# Patient Record
Sex: Male | Born: 1945 | Race: White | Hispanic: No | Marital: Married | Smoking: Never smoker
Health system: Southern US, Community
[De-identification: ages and names within clinical notes are randomized; demographics above are authoritative.]

---

## 2017-10-24 ENCOUNTER — Other Ambulatory Visit: Payer: Self-pay

## 2017-10-24 ENCOUNTER — Emergency Department (INDEPENDENT_AMBULATORY_CARE_PROVIDER_SITE_OTHER): Payer: Medicare Other

## 2017-10-24 ENCOUNTER — Emergency Department (INDEPENDENT_AMBULATORY_CARE_PROVIDER_SITE_OTHER)
Admission: EM | Admit: 2017-10-24 | Discharge: 2017-10-24 | Disposition: A | Discharge status: Home / Self Care | Payer: Medicare Other | Source: Home / Self Care | Attending: Family Medicine | Admitting: Family Medicine

## 2017-10-24 DIAGNOSIS — I70201 Unspecified atherosclerosis of native arteries of extremities, right leg: Secondary | ICD-10-CM | POA: Diagnosis not present

## 2017-10-24 DIAGNOSIS — S8391XA Sprain of unspecified site of right knee, initial encounter: Secondary | ICD-10-CM

## 2017-10-24 DIAGNOSIS — M1711 Unilateral primary osteoarthritis, right knee: Secondary | ICD-10-CM | POA: Diagnosis not present

## 2017-10-24 MED ORDER — MELOXICAM 15 MG PO TABS: 15.0000 mg | ORAL_TABLET | Freq: Every day | ORAL | 0 refills | Status: DC

## 2017-10-24 MED ORDER — MELOXICAM 15 MG PO TABS: 15.0000 mg | ORAL_TABLET | Freq: Every day | ORAL | 0 refills | Status: AC

## 2017-10-24 NOTE — ED Triage Notes (Signed)
Pt c/o right knee pain x 10 days. Yesterday stepped over a small fence and heard a pop. Pain level 6/7 when walking on it. Tried tylenol and ice/heat with no relief.

## 2017-10-24 NOTE — ED Provider Notes (Signed)
[facility]    CSN: [id number] Arrival date & time: 10/24/17  0935     History   Chief Complaint Chief Complaint  Patient presents with  . Knee Pain    Right    HPI Gary Cervantes is a 72 y.o. male.   Patient complains of mild soreness and stiffness in his right knee during the past 10 days.  Yesterday he stepped over a small fence and felt a sudden painful popping sensation in his right knee.  He now has pain with weight bearing, and a frequent sensation of giving way.  The history is provided by the patient and the spouse.  Knee Pain  Location:  Knee Time since incident:  1 day Injury: yes   Mechanism of injury comment:  Stepping over a fence Knee location:  R knee Pain details:    Quality:  Aching   Radiates to:  Does not radiate   Severity:  Moderate   Onset quality:  Sudden   Duration:  1 day   Timing:  Constant   Progression:  Unchanged Chronicity:  New Dislocation: no   Prior injury to area:  No Relieved by:  Nothing Worsened by:  Bearing weight and activity Ineffective treatments:  Acetaminophen, heat and ice Associated symptoms: decreased ROM   Associated symptoms: no back pain, no muscle weakness, no numbness, no stiffness, no swelling and no tingling     History reviewed. No pertinent past medical history.  There are no active problems to display for this patient.   History reviewed. No pertinent surgical history.     Home Medications    Prior to Admission medications   Medication Sig Start Date End Date Taking? Authorizing Provider  meloxicam (MOBIC) 15 MG tablet Take 1 tablet (15 mg total) by mouth daily. Take with food each morning 10/24/17   Lattie Haw, MD    Family History Family History  Family history unknown: Yes    Social History Social History   Tobacco Use  . Smoking status: Never Smoker  . Smokeless tobacco: Never Used  Substance Use Topics  . Alcohol use: Never    Frequency: Never  . Drug use:  Never     Allergies   Patient has no known allergies.   Review of Systems Review of Systems  Musculoskeletal: Negative for back pain and stiffness.  All other systems reviewed and are negative.    Physical Exam Triage Vital Signs ED Triage Vitals  Enc Vitals Group     BP 10/24/17 1019 126/80     Pulse Rate 10/24/17 1019 63     Resp 10/24/17 1019 18     Temp 10/24/17 1019 (!) 97.5 F (36.4 C)     Temp Source 10/24/17 1019 Oral     SpO2 10/24/17 1019 98 %     Weight 10/24/17 1020 255 lb (115.7 kg)     Height 10/24/17 1020 5\' 11"  (1.803 m)     Head Circumference --      Peak Flow --      Pain Score 10/24/17 1020 6     Pain Loc --      Pain Edu? --      Excl. in GC? --    No data found.  Updated Vital Signs BP 126/80 (BP Location: Right Arm)   Pulse 63   Temp (!) 97.5 F (36.4 C) (Oral)   Resp 18   Ht 5\' 11"  (1.803 m)   Wt 255 lb (115.7  kg)   SpO2 98%   BMI 35.57 kg/m   Visual Acuity Right Eye Distance:   Left Eye Distance:   Bilateral Distance:    Right Eye Near:   Left Eye Near:    Bilateral Near:     Physical Exam  Constitutional: He appears well-developed and well-nourished. No distress.  HENT:  Head: Atraumatic.  Eyes: Pupils are equal, round, and reactive to light.  Cardiovascular: Normal rate.  Pulmonary/Chest: Effort normal.  Musculoskeletal: He exhibits no edema.       Right knee: He exhibits decreased range of motion. He exhibits no swelling, no effusion, no ecchymosis, no deformity, no laceration, no erythema, normal alignment, no LCL laxity, normal patellar mobility, normal meniscus and no MCL laxity. No tenderness found.  Right knee:  No effusion, erythema, or warmth.  Knee stable, negative drawer test.  McMurray test negative.  No distinct tenderness to palpation.  Neurological: He is alert.  Skin: Skin is warm and dry.  Nursing note and vitals reviewed.    UC Treatments / Results  Labs (all labs ordered are listed, but only  abnormal results are displayed) Labs Reviewed - No data to display  EKG None  Radiology Dg Knee Complete 4 Views Right  Result Date: 10/24/2017 CLINICAL DATA:  Right knee pain and stiffness for 10 days, popping sensation EXAM: RIGHT KNEE - COMPLETE 4+ VIEW COMPARISON:  None available FINDINGS: Normal alignment without acute osseous finding, definite fracture, or effusion. Mild degenerative changes of the medial compartment noted with joint space loss and bony spurring. Medial femoral condyle irregularity and subchondral lucency noted suspicious for osteochondral defect/injury, possibly chronic. Peripheral atherosclerosis noted. No definite soft tissue abnormality. 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 Peripheral atherosclerosis Electronically Signed   By: Judie PetitM.  Shick M.D.   On: 10/24/2017 10:46    Procedures Procedures (including critical care time)  Medications Ordered in UC Medications - No data to display  Initial Impression / Assessment and Plan / UC Course  I have reviewed the triage vital signs and the nursing notes.  Pertinent labs & imaging results that were available during my care of the patient were reviewed by me and considered in my medical decision making (see chart for details).    ?meniscus injury; exam rather benign. Dispensed hinged knee brace.  Begin Mobic. Followup with Dr. Rodney Langtonhomas Thekkekandam or Dr. Clementeen GrahamEvan [facility] ([facility]) if not improving about two weeks.    Final Clinical Impressions(s) / UC Diagnoses   Final diagnoses:  Sprain of right knee, unspecified ligament, initial encounter     Discharge Instructions     Wear knee brace until improved.  Apply ice pack for 20 to 30 minutes, 2 to 3 times daily for about 3 days.  Begin stretching and range of motion exercises as tolerated.   ED Prescriptions    Medication Sig Dispense  Auth. Provider   meloxicam (MOBIC) 15 MG tablet Take 1 tablet (15 mg total) by mouth daily. Take with food each morning 15 tablet Lattie HawBeese, Karter Hellmer A, MD        Lattie HawBeese, Storey Stangeland A, MD 10/26/17 [phone]

## 2017-10-24 NOTE — Discharge Instructions (Signed)
Wear knee brace until improved.  Apply ice pack for 20 to 30 minutes, 2 to 3 times daily for about 3 days.  Begin stretching and range of motion exercises as tolerated.

## 2019-04-29 IMAGING — DX DG KNEE COMPLETE 4+V*R*
4 series · 4 of 4 positions shown · non-contrast
Comparison: None available

CLINICAL DATA: Right knee pain and stiffness for 10 days, popping
sensation

EXAM:
RIGHT KNEE - COMPLETE 4+ VIEW

[knee ap]
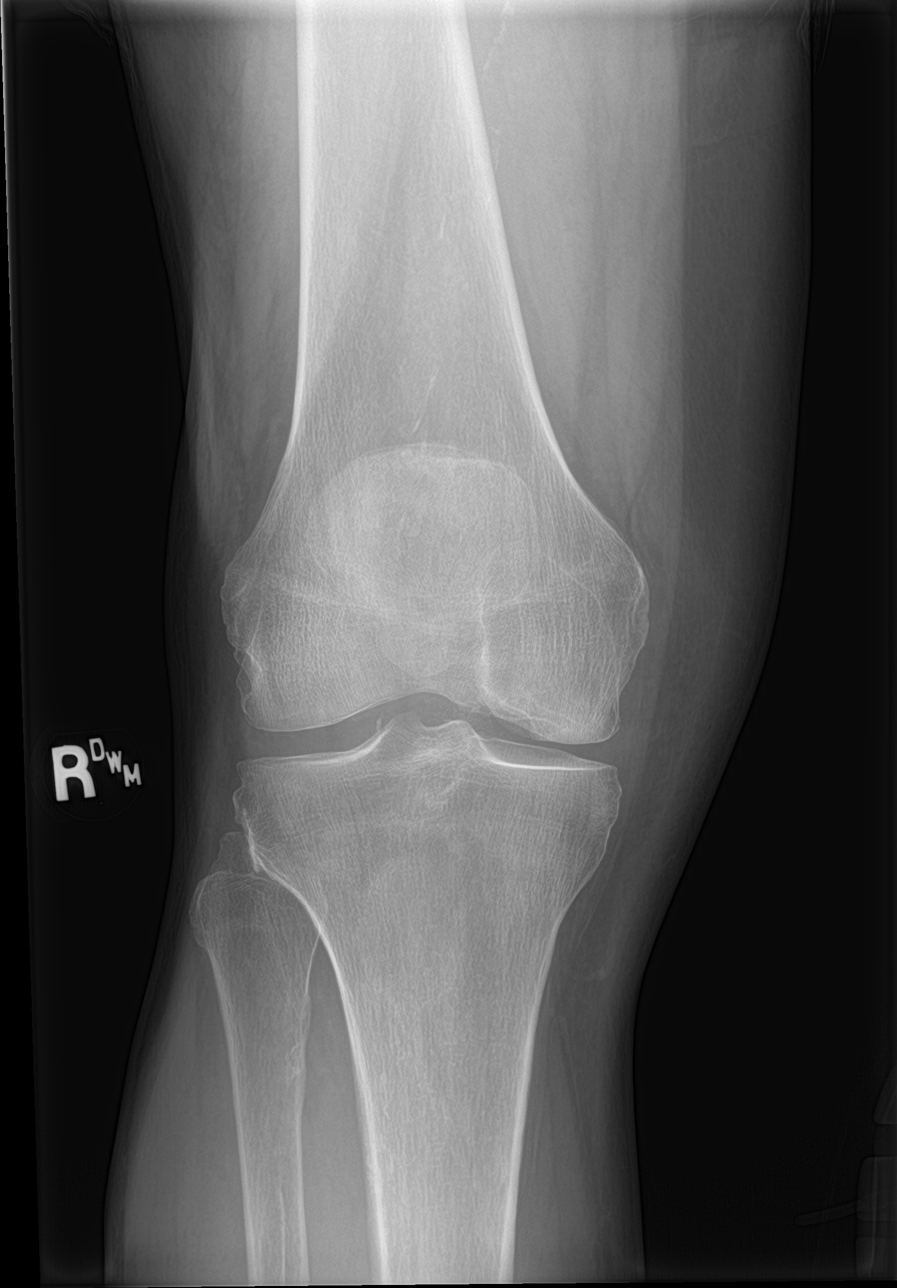

[knee obl (1 of 2)]
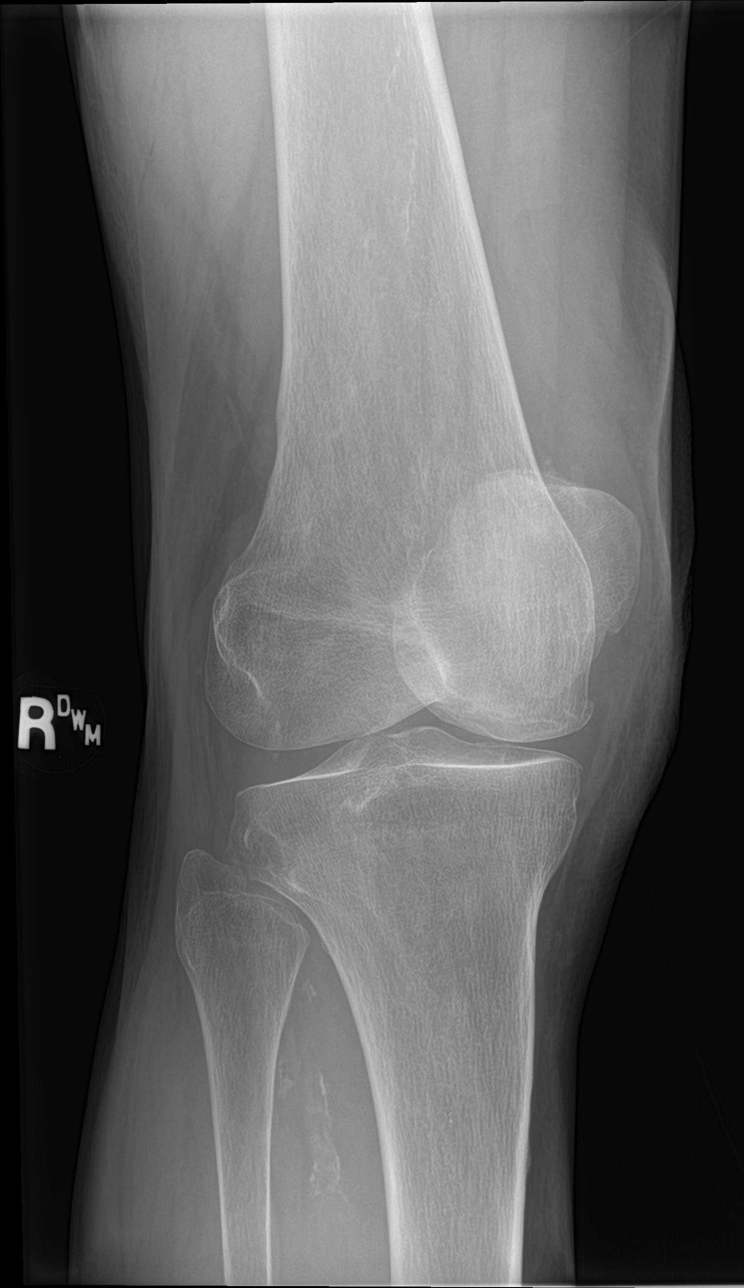

[knee obl (2 of 2)]
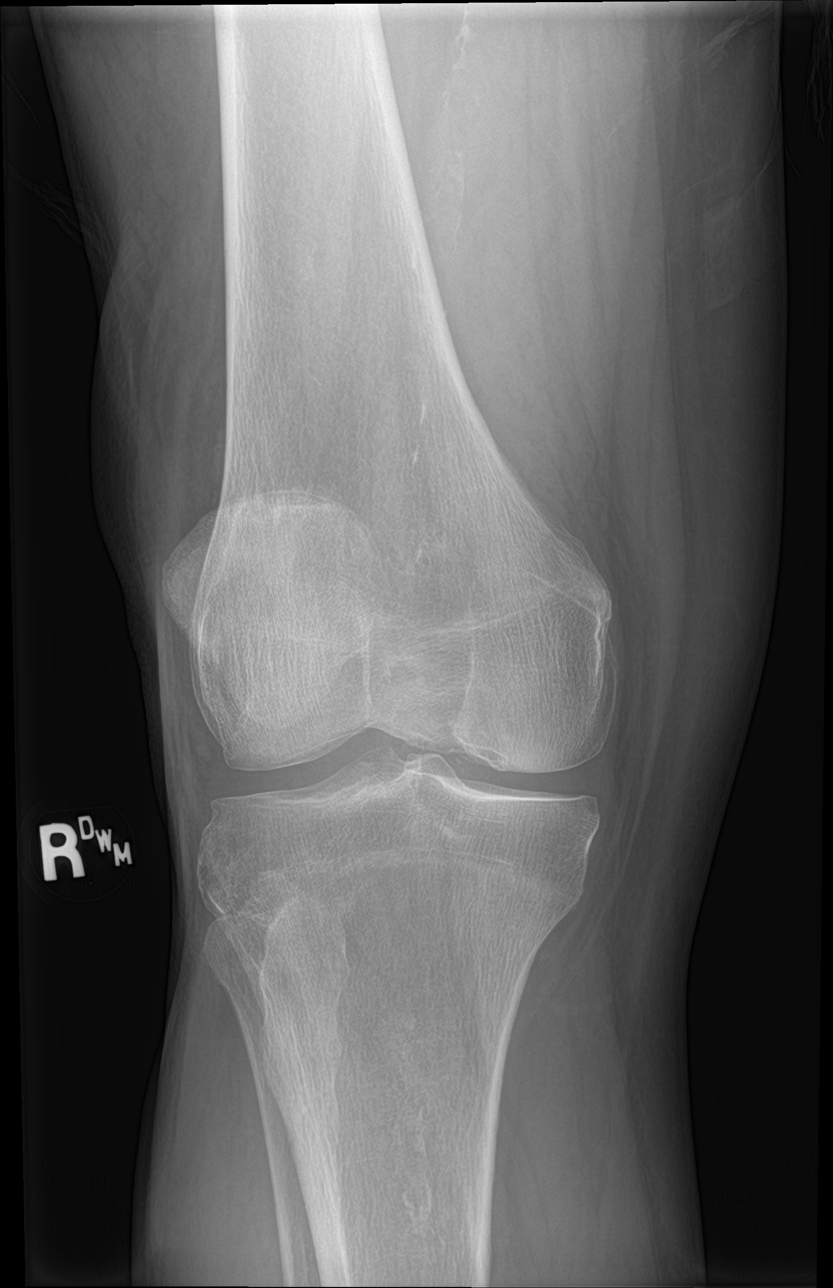

[knee lat]
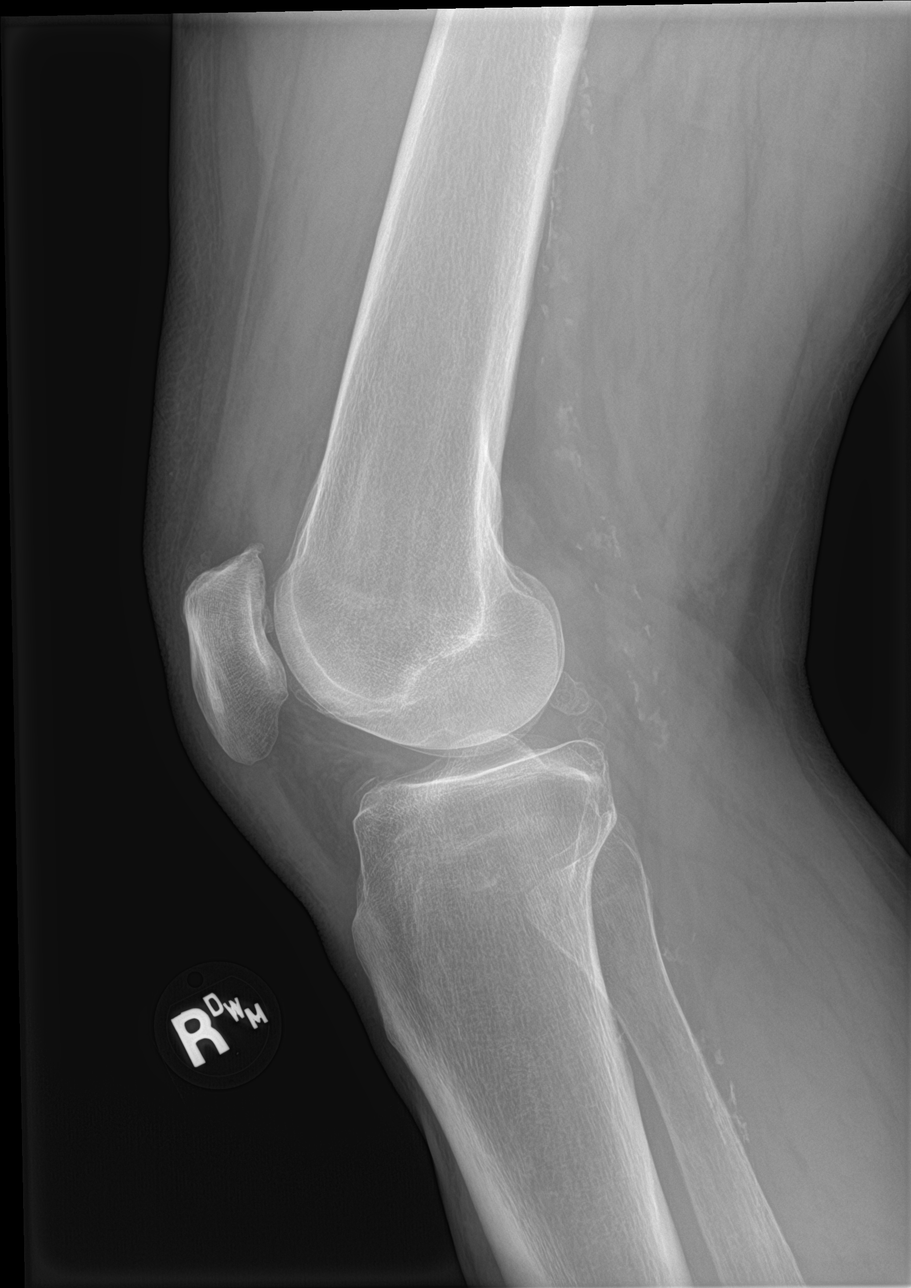

[4 of 4 positions shown; findings below may reference images not displayed]

FINDINGS: Normal alignment without acute osseous finding, definite fracture,
or effusion. Mild degenerative changes of the medial compartment
noted with joint space loss and bony spurring. Medial femoral
condyle irregularity and subchondral lucency noted suspicious for
osteochondral defect/injury, possibly chronic.

Peripheral atherosclerosis noted. No definite soft tissue
abnormality.
IMPRESSION: No acute osseous finding or effusion

Medial compartment osteoarthritis

Right medial femoral condyle osteochondral defect suspected, likely
chronic.

Peripheral atherosclerosis

## 2021-11-15 ENCOUNTER — Encounter: Payer: Self-pay | Admitting: Emergency Medicine

## 2021-11-15 ENCOUNTER — Emergency Department (INDEPENDENT_AMBULATORY_CARE_PROVIDER_SITE_OTHER)
Admission: EM | Admit: 2021-11-15 | Discharge: 2021-11-15 | Disposition: A | Discharge status: Home / Self Care | Payer: Medicare Other | Source: Home / Self Care

## 2021-11-15 DIAGNOSIS — S61011D Laceration without foreign body of right thumb without damage to nail, subsequent encounter: Secondary | ICD-10-CM

## 2021-11-15 NOTE — ED Notes (Signed)
.  Patient is being discharged from the Urgent Care and sent to the Emergency Department via POV . Per micheal ragan NP, patient is in need of higher level of care due to laceration. Patient is aware and verbalizes understanding of plan of care.  Vitals:   11/15/21 1441  BP: 112/75  Pulse: 75  Resp: 18  Temp: 97.8 F (36.6 C)  SpO2: 95%   Sent to [facility] medical center for laceration to right thumb.

## 2021-11-15 NOTE — ED Provider Notes (Signed)
[facility]    CSN: [id number] Arrival date & time: 11/15/21  1356      History   Chief Complaint Chief Complaint  Patient presents with   Laceration    HPI Gary Cervantes is a 76 y.o. male.   HPI Very pleasant 76 year old male presents with right thumb laceration that occurred while slicing onions 1.5 hour ago at his home.  Patient is accompanied by his wife  History reviewed. No pertinent past medical history.  There are no problems to display for this patient.   History reviewed. No pertinent surgical history.     Home Medications    Prior to Admission medications   Medication Sig Start Date End Date Taking? Authorizing Provider  finasteride (PROSCAR) 5 MG tablet Take by mouth. 06/25/21 06/20/22 Yes [provider]  rosuvastatin (CRESTOR) 10 MG tablet Take 1 tablet by mouth at bedtime. 07/31/21  Yes [provider]  sildenafil (REVATIO) 20 MG tablet One to five tablets orally, 1 hour prior to relations 02/24/20  Yes [provider]  tamsulosin (FLOMAX) 0.4 MG CAPS capsule Take by mouth. 06/25/21 06/20/22 Yes [provider]  losartan (COZAAR) 50 MG tablet Take 50 mg by mouth daily. 10/16/21   [provider]  meloxicam (MOBIC) 15 MG tablet Take 1 tablet (15 mg total) by mouth daily. Take with food each morning 10/24/17   Lattie Haw, MD    Family History Family History  Family history unknown: Yes    Social History Social History   Tobacco Use   Smoking status: Never   Smokeless tobacco: Never  Vaping Use   Vaping Use: Never used  Substance Use Topics   Alcohol use: Never   Drug use: Never     Allergies   Patient has no known allergies.   Review of Systems Review of Systems  Skin:  Positive for wound.  All other systems reviewed and are negative.    Physical Exam Triage Vital Signs ED Triage Vitals  Enc Vitals Group     BP 11/15/21 1441 112/75     Pulse Rate 11/15/21 1441 75     Resp  11/15/21 1441 18     Temp 11/15/21 1441 97.8 F (36.6 C)     Temp Source 11/15/21 1441 Oral     SpO2 11/15/21 1441 95 %     Weight --      Height --      Head Circumference --      Peak Flow --      Pain Score 11/15/21 1455 0     Pain Loc --      Pain Edu? --      Excl. in GC? --    No data found.  Updated Vital Signs BP 112/75 (BP Location: Left Arm)   Pulse 75   Temp 97.8 F (36.6 C) (Oral)   Resp 18   SpO2 95%      Physical Exam Vitals and nursing note reviewed.  Constitutional:      General: He is not in acute distress.    Appearance: Normal appearance. He is obese. He is not ill-appearing.  HENT:     Head: Normocephalic and atraumatic.     Mouth/Throat:     Mouth: Mucous membranes are moist.     Pharynx: Oropharynx is clear.  Eyes:     Extraocular Movements: Extraocular movements intact.     Conjunctiva/sclera: Conjunctivae normal.     Pupils: Pupils are equal, round,  and reactive to light.  Cardiovascular:     Rate and Rhythm: Normal rate and regular rhythm.     Pulses: Normal pulses.     Heart sounds: Normal heart sounds. No murmur heard. Pulmonary:     Effort: Pulmonary effort is normal.     Breath sounds: Normal breath sounds. No wheezing, rhonchi or rales.  Musculoskeletal:     Cervical back: Normal range of motion and neck supple.  Skin:    General: Skin is warm and dry.     Comments: Right thumb (medial aspect of DIP adjacent to nail plate: Avulsion laceration ~1.5 cm x 0.5 cm oval-shaped with small portion of nail plate removed as well; small tourniquet used over base of right thumb use for temporary homeostasis to evaluate wound.  Neurological:     General: No focal deficit present.     Mental Status: He is alert and oriented to person, place, and time. Mental status is at baseline.      UC Treatments / Results  Labs (all labs ordered are listed, but only abnormal results are displayed) Labs Reviewed - No data to  display  EKG   Radiology No results found.  Procedures Procedures (including critical care time)  Medications Ordered in UC Medications - No data to display  Initial Impression / Assessment and Plan / UC Course  I have reviewed the triage vital signs and the nursing notes.  Pertinent labs & imaging results that were available during my care of the patient were reviewed by me and considered in my medical decision making (see chart for details).     MDM: 1.  Laceration of right thumb without foreign body, nail damage status unspecified, initial encounter-compression pressure bandage placed on affected area of right thumb prior to discharge. Instructed patient to go to [facility] now for further evaluation of right thumb laceration.  Patient and wife agreed and verbalized understanding of these instructions and this plan of care this afternoon.  Staff RN has called ahead to this facility for patient to make them aware of his arrival.  Patient discharged, hemodynamically stable.  Final Clinical Impressions(s) / UC Diagnoses   Final diagnoses:  Laceration of right thumb without foreign body, nail damage status unspecified, subsequent encounter     Discharge Instructions      Instructed patient to go to [facility] now for further evaluation of right thumb laceration.     ED Prescriptions   None    PDMP not reviewed this encounter.   Trevor Iha, FNP 11/15/21 1529

## 2021-11-15 NOTE — ED Triage Notes (Signed)
Pt states he was cutting a onion at home about 23 hours ago andd cut his right thumb with knife. Denies pain or numbness, no ton blood thinners.

## 2021-11-15 NOTE — Discharge Instructions (Addendum)
Instructed patient to go to [facility] now for further evaluation of right thumb laceration.
